# Patient Record
Sex: Female | Born: 2000 | Race: Black or African American | Hispanic: No | Marital: Single | Smoking: Never smoker
Health system: Southern US, Community
[De-identification: ages and names within clinical notes are randomized; demographics above are authoritative.]

---
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
## 2021-11-22 ENCOUNTER — Other Ambulatory Visit: Payer: Self-pay

## 2021-11-22 ENCOUNTER — Inpatient Hospital Stay (HOSPITAL_COMMUNITY)
Admission: AD | Admit: 2021-11-22 | Discharge: 2021-11-22 | Discharge status: Absent without leave | Payer: No Typology Code available for payment source | Attending: Obstetrics and Gynecology | Admitting: Obstetrics and Gynecology

## 2023-04-10 ENCOUNTER — Other Ambulatory Visit: Payer: Self-pay

## 2023-04-10 ENCOUNTER — Encounter (HOSPITAL_BASED_OUTPATIENT_CLINIC_OR_DEPARTMENT_OTHER): Payer: Self-pay

## 2023-04-10 ENCOUNTER — Emergency Department (HOSPITAL_BASED_OUTPATIENT_CLINIC_OR_DEPARTMENT_OTHER)
Admission: EM | Admit: 2023-04-10 | Discharge: 2023-04-10 | Disposition: A | Discharge status: Home / Self Care | Payer: No Typology Code available for payment source | Attending: Emergency Medicine | Admitting: Emergency Medicine

## 2023-04-10 DIAGNOSIS — R110 Nausea: Secondary | ICD-10-CM | POA: Insufficient documentation

## 2023-04-10 DIAGNOSIS — R519 Headache, unspecified: Secondary | ICD-10-CM | POA: Diagnosis present

## 2023-04-10 LAB — PREGNANCY, URINE: Preg Test, Ur: NEGATIVE

## 2023-04-10 LAB — URINALYSIS, MICROSCOPIC (REFLEX)

## 2023-04-10 LAB — URINALYSIS, ROUTINE W REFLEX MICROSCOPIC
Bilirubin Urine: NEGATIVE
Glucose, UA: NEGATIVE mg/dL
Hgb urine dipstick: NEGATIVE
Ketones, ur: 15 mg/dL — AB
Nitrite: NEGATIVE
Protein, ur: 30 mg/dL — AB
Specific Gravity, Urine: 1.015 (ref 1.005–1.030)
pH: 8.5 — ABNORMAL HIGH (ref 5.0–8.0)

## 2023-04-10 MED ORDER — ONDANSETRON 4 MG PO TBDP
4.0000 mg | ORAL_TABLET | Freq: Once | ORAL | Status: AC | PRN
  Administered 2023-04-10: 4 mg via ORAL
  Filled 2023-04-10: qty 1

## 2023-04-10 MED ORDER — METOCLOPRAMIDE HCL 5 MG/ML IJ SOLN
10.0000 mg | Freq: Once | INTRAMUSCULAR | Status: AC
  Administered 2023-04-10: 10 mg via INTRAMUSCULAR
  Filled 2023-04-10: qty 2

## 2023-04-10 MED ORDER — DIPHENHYDRAMINE HCL 25 MG PO CAPS
25.0000 mg | ORAL_CAPSULE | Freq: Once | ORAL | Status: AC
  Administered 2023-04-10: 25 mg via ORAL
  Filled 2023-04-10: qty 1

## 2023-04-10 NOTE — Discharge Instructions (Signed)
Please read and follow all provided instructions.  Your diagnoses today include:  1. Acute nonintractable headache, unspecified headache type     Tests performed today include: Vital signs. See below for your results today.   Medications:  In the Emergency Department you received: Reglan - antinausea/headache medication Benadryl - antihistamine to counteract potential side effects of reglan  Take any prescribed medications only as directed.  Additional information:  Follow any educational materials contained in this packet.  You are having a headache. No specific cause was found today for your headache. It may have been a migraine or other cause of headache. Stress, anxiety, fatigue, and depression are common triggers for headaches.   Your headache today does not appear to be life-threatening or require hospitalization, but often the exact cause of headaches is not determined in the emergency department. Therefore, follow-up with your doctor is very important to find out what may have caused your headache and whether or not you need any further diagnostic testing or treatment.   Sometimes headaches can appear benign (not harmful), but then more serious symptoms can develop which should prompt an immediate re-evaluation by your doctor or the emergency department.  BE VERY CAREFUL not to take multiple medicines containing Tylenol (also called acetaminophen). Doing so can lead to an overdose which can damage your liver and cause liver failure and possibly death.   Follow-up instructions: Please follow-up with your primary care provider in the next 3 days for further evaluation of your symptoms.   Return instructions:  Please return to the Emergency Department if you experience worsening symptoms. Return if the medications do not resolve your headache, if it recurs, or if you have multiple episodes of vomiting or cannot keep down fluids. Return if you have a change from the usual  headache. RETURN IMMEDIATELY IF you: Develop a sudden, severe headache Develop confusion or become poorly responsive or faint Develop a fever above 100.30F or problem breathing Have a change in speech, vision, swallowing, or understanding Develop new weakness, numbness, tingling, incoordination in your arms or legs Have a seizure Please return if you have any other emergent concerns.  Additional Information:  Your vital signs today were: BP (!) 146/91 (BP Location: Left Arm)   Pulse 64   Temp 98.5 F (36.9 C) (Oral)   Resp 18   Ht 5\' 6"  (1.676 m)   Wt 67.1 kg   SpO2 100%   BMI 23.89 kg/m  If your blood pressure (BP) was elevated above 135/85 this visit, please have this repeated by your doctor within one month. --------------

## 2023-04-10 NOTE — ED Provider Notes (Signed)
[place] [facility]ERGENCY DEPARTMENT AT [facility] Provider Note   CSN: [id number] Arrival date & time: 04/10/23  1123     History  Chief Complaint  Patient presents with   Nausea    Mackenzie Ramirez is a 22 y.o. female.  Patient presents the emergency department for evaluation of nausea and headache.  She states that she has had headaches in the past, when she was younger.  She reports a throbbing frontal headache starting yesterday.  She took Tylenol last night with improvement.  When she woke this morning the headache had returned.  She denies photophobia, phonophobia.  No fevers, neck pain, confusion.  No URI symptoms.  No dental pain or sinus pain.  Denies any falls or head injuries.  She has had nausea but no vomiting.       Home Medications Prior to Admission medications   Medication Sig Start Date End Date Taking? Authorizing Provider  naproxen (NAPROSYN) 375 MG tablet Take 1 tablet (375 mg total) by mouth 2 (two) times daily. 11/14/19   Petrucelli, Pleas Koch, PA-C      Allergies    Patient has no known allergies.    Review of Systems   Review of Systems  Physical Exam Updated Vital Signs BP (!) 146/91 (BP Location: Left Arm)   Pulse 64   Temp 98.5 F (36.9 C) (Oral)   Resp 18   Ht 5\' 6"  (1.676 m)   Wt 67.1 kg   SpO2 100%   BMI 23.89 kg/m   Physical Exam Vitals and nursing note reviewed.  Constitutional:      Appearance: She is well-developed.  HENT:     Head: Normocephalic and atraumatic.     Right Ear: Tympanic membrane, ear canal and external ear normal.     Left Ear: Tympanic membrane, ear canal and external ear normal.     Nose: Nose normal.     Mouth/Throat:     Pharynx: Uvula midline.  Eyes:     General: Lids are normal.     Extraocular Movements:     Right eye: No nystagmus.     Left eye: No nystagmus.     Conjunctiva/sclera: Conjunctivae normal.     Pupils: Pupils are equal, round, and reactive to light.  Cardiovascular:      Rate and Rhythm: Normal rate and regular rhythm.  Pulmonary:     Effort: Pulmonary effort is normal.     Breath sounds: Normal breath sounds.  Abdominal:     Palpations: Abdomen is soft.     Tenderness: There is no abdominal tenderness.  Musculoskeletal:     Cervical back: Normal range of motion and neck supple. No tenderness or bony tenderness.  Skin:    General: Skin is warm and dry.  Neurological:     Mental Status: She is alert and oriented to person, place, and time.     GCS: GCS eye subscore is 4. GCS verbal subscore is 5. GCS motor subscore is 6.     Cranial Nerves: No cranial nerve deficit.     Sensory: No sensory deficit.     Motor: No weakness.     Coordination: Coordination normal.     Gait: Gait normal.     Comments: Upper extremity myotomes tested bilaterally:  C5 Shoulder abduction 5/5 C6 Elbow flexion/wrist extension 5/5 C7 Elbow extension 5/5 C8 Finger flexion 5/5 T1 Finger abduction 5/5  Lower extremity myotomes tested bilaterally: L2 Hip flexion 5/5 L3 Knee extension 5/5 L4 Ankle  dorsiflexion 5/5 S1 Ankle plantar flexion 5/5      ED Results / Procedures / Treatments   Labs (all labs ordered are listed, but only abnormal results are displayed) Labs Reviewed  URINALYSIS, ROUTINE W REFLEX MICROSCOPIC - Abnormal; Notable for the following components:      Result Value   pH 8.5 (*)    Ketones, ur 15 (*)    Protein, ur 30 (*)    Leukocytes,Ua SMALL (*)    All other components within normal limits  URINALYSIS, MICROSCOPIC (REFLEX) - Abnormal; Notable for the following components:   Bacteria, UA FEW (*)    All other components within normal limits  PREGNANCY, URINE    EKG None  Radiology No results found.  Procedures Procedures    Medications Ordered in ED Medications  metoCLOPramide (REGLAN) injection 10 mg (has no administration in time range)  diphenhydrAMINE (BENADRYL) capsule 25 mg (has no administration in time range)  ondansetron  (ZOFRAN-ODT) disintegrating tablet 4 mg (4 mg Oral Given 04/10/23 1140)    ED Course/ Medical Decision Making/ A&P    Patient seen and examined. History obtained directly from patient. Work-up including labs, imaging, EKG ordered in triage, if performed, were reviewed.    Labs/EKG: Independently reviewed and interpreted.  This included: Pregnancy negative, UA without compelling signs of infection.  Imaging: None ordered  Medications/Fluids: Ordered: Parent is driving home, IM Reglan, p.o. Benadryl ordered  Most recent vital signs reviewed and are as follows: BP (!) 146/91 (BP Location: Left Arm)   Pulse 64   Temp 98.5 F (36.9 C) (Oral)   Resp 18   Ht 5\' 6"  (1.676 m)   Wt 67.1 kg   SpO2 100%   BMI 23.89 kg/m   Initial impression: Headache, no red flags or reassuring exam.  Will treat symptoms.  1:54 PM Reassessment performed. Patient appears stable.  Family ember now bedside.  Patient states that she is feeling a lot better.  She is comfortable with discharge to home.  Reviewed pertinent lab work and imaging with patient at bedside. Questions answered.   Most current vital signs reviewed and are as follows: BP (!) 146/91 (BP Location: Left Arm)   Pulse 64   Temp 98.5 F (36.9 C) (Oral)   Resp 18   Ht 5\' 6"  (1.676 m)   Wt 67.1 kg   SpO2 100%   BMI 23.89 kg/m   Plan: Discharge to home.   Prescriptions written for: None  Other home care instructions discussed: Rest, hydration, avoidance of triggers  ED return instructions discussed: Worsening severe headache, confusion, vomiting, fever, new symptoms or other concerns  Follow-up instructions discussed: Patient encouraged to follow-up with their PCP as needed for recurrent headache pattern.                                Medical Decision Making Amount and/or Complexity of Data Reviewed Labs: ordered.  Risk Prescription drug management.   In regards to the patient's headache, critical differentials were  considered including subarachnoid hemorrhage, intracerebral hemorrhage, epidural/subdural hematoma, pituitary apoplexy, vertebral/carotid artery dissection, giant cell arteritis, central venous thrombosis, reversible cerebral vasoconstriction, acute angle closure glaucoma, idiopathic intracranial hypertension, bacterial meningitis, viral encephalitis, carbon monoxide poisoning, posterior reversible encephalopathy syndrome, pre-eclampsia.   Reg flag symptoms related to these causes were considered including systemic symptoms (fever, weight loss), neurologic symptoms (confusion, mental status change, vision change, associated seizure), acute or sudden "thunderclap" onset,  patient age 49 or older with new or progressive headache, patient of any age with first headache or change in headache pattern, pregnant or postpartum status, history of HIV or other immunocompromise, history of cancer, headache occurring with exertion, associated neck or shoulder pain, associated traumatic injury, concurrent use of anticoagulation, family history of spontaneous SAH, and concurrent drug use.    Other benign, more common causes of headache were considered including migraine, tension-type headache, cluster headache, referred pain from other cause such as sinus infection, dental pain, trigeminal neuralgia.   On exam, patient has a reassuring neuro exam including baseline mental status, no significant neck pain or meningeal signs, no signs of severe infection or fever.   The patient's vital signs, pertinent lab work and imaging were reviewed and interpreted as discussed in the ED course. Hospitalization was considered for further testing, treatments, or serial exams/observation. However as patient is well-appearing, has a stable exam over the course of their evaluation, and reassuring studies today, I do not feel that they warrant admission at this time. This plan was discussed with the patient who verbalizes agreement and  comfort with this plan and seems reliable and able to return to the Emergency Department with worsening or changing symptoms.          Final Clinical Impression(s) / ED Diagnoses Final diagnoses:  Acute nonintractable headache, unspecified headache type    Rx / DC Orders ED Discharge Orders     None         Renne Crigler, PA-C 04/10/23 1355    Pricilla Loveless, MD 04/10/23 1452

## 2023-04-10 NOTE — ED Triage Notes (Signed)
Pt c/o nausea & headache that started last night.

## 2024-08-05 ENCOUNTER — Encounter: Payer: Self-pay | Admitting: Internal Medicine

## 2024-08-05 ENCOUNTER — Ambulatory Visit (INDEPENDENT_AMBULATORY_CARE_PROVIDER_SITE_OTHER): Admitting: Internal Medicine

## 2024-08-05 ENCOUNTER — Other Ambulatory Visit: Payer: Self-pay

## 2024-08-05 VITALS — BP 112/72 | HR 70 | Temp 98.1°F | Resp 18 | Ht 67.0 in | Wt 173.4 lb

## 2024-08-05 DIAGNOSIS — T783XXD Angioneurotic edema, subsequent encounter: Secondary | ICD-10-CM | POA: Diagnosis not present

## 2024-08-05 DIAGNOSIS — J310 Chronic rhinitis: Secondary | ICD-10-CM

## 2024-08-05 DIAGNOSIS — T783XXA Angioneurotic edema, initial encounter: Secondary | ICD-10-CM

## 2024-08-05 DIAGNOSIS — R21 Rash and other nonspecific skin eruption: Secondary | ICD-10-CM | POA: Diagnosis not present

## 2024-08-05 NOTE — Patient Instructions (Signed)
 Recurrent angioedema of face and tongue Recurrent episodes of angioedema involving the face and tongue since April 2023, with swelling and itching with some hives noted on pictures, but no systemic symptoms.  Episodes lasted for around 1 week.  Suspect histamine mediated.  No history of food or medication allergies, except for a past reaction to kale. Consider contact dermatitis. - Schedule allergy testing for environmental allergens on August 12, 2024, at 9:00 AM. (1-55) - Advise to avoid taking Zyrtec or other antihistamines before the allergy test. - if this occurs again, start Zyrtec 10 mg - 20 mg up to twice daily as needed until symptoms resolve and schedule a follow-up.  Seasonal allergic rhinitis Seasonal allergic rhinitis typically occurs during spring and summer. Symptoms managed with Flonase and occasional use of Zyrtec. - Continue using Flonase 1 to 2 sprays daily as needed. - Use Zyrtec 10 mg daily as needed for symptom relief, but avoid before allergy testing.  Recurrent fungal rash of left breast Recurrent episodes of itching and flaking of the left breast, suggestive of a fungal infection, possibly due to moisture and sweat accumulation. Previously treated with antifungal cream with good response. - Continue using antifungal cream as needed for symptom relief. - Monitor for recurrence and consider patch testing if symptoms do not respond to antifungal treatment.  Follow up : next Friday at 9 AM (1-55) must be off antihistamines for 3 days prior to visit It was a pleasure meeting you in clinic today! Thank you for allowing me to participate in your care.  Rocky Endow, MD [facility]

## 2024-08-05 NOTE — Progress Notes (Signed)
 NEW PATIENT Date of Service/Encounter:   08/05/2024 Referring provider: No ref. provider found Primary care provider: Dwane Alfonso RIGGERS  Subjective:  Mackenzie Ramirez is a 23 y.o. female presenting today for evaluation of history of angioedema with rash and chronic rhinitis. History obtained from: chart review and patient.   Discussed the use of AI scribe software for clinical note transcription with the patient, who gave verbal consent to proceed.  History of Present Illness Mackenzie Ramirez is a 23 year old female who presents with recurrent episodes of facial and tongue swelling and itching.  Angioedema and pruritus - Recurrent episodes of facial and tongue swelling with associated pruritus since April 2023 - Initial episode lasted approximately 1.5 weeks, with significant swelling and itching around the eyes and tongue - Symptoms were pruritic but without significant rash - on review of pictures, angioedema noted on eyelids with hives under lids and partial tongue angioedema - No identifiable trigger; attended a party the night before initial episode - Hydrocortisoneprovided symptomatic relief for itching during initial episode - did not trial antihistamines for symptoms  Localized rash and pruritus - In August 2024, developed pruritus and a rash with peeling and flaking skin on the left breast - Rash and itching resolved with use of prescribed antifungal cream - Rash recurred after visiting boyfriend in [place], again resolved with antifungal cream, currently not present  Allergic and atopic history - History of seasonal allergies, particularly during spring and summer - Occasional use of Zyrtec for allergy symptoms - uses flonase regularly, especially during warmer months. - No recent allergy testing; last testing performed in elementary school - No history of acid reflux, asthma, or eczema - No known food or medication allergies except for a past reaction to kale,  which caused hand swelling; avoids kale as a precaution - Tolerates spinach without issues   Past Medical History: No past medical history on file. Medication List:  Current Outpatient Medications  Medication Sig Dispense Refill   fluticasone (FLONASE) 50 MCG/ACT nasal spray Place 1 spray into the nose.     naproxen  (NAPROSYN ) 375 MG tablet Take 1 tablet (375 mg total) by mouth 2 (two) times daily. 10 tablet 0   terconazole (TERAZOL 7) 0.4 % vaginal cream Place 1 applicator vaginally at bedtime.     No current facility-administered medications for this visit.   Known Allergies:  No Known Allergies Past Surgical History: No past surgical history on file. Family History: No family history on file. Social History: Mackenzie Ramirez lives in a house built 6 years ago, no water damage, carpet in the bedroom, gas and electric heating, central AC, no pets, no roaches, not using dust mite covers on the bed of the pillows, no smoke exposure.  Works as a Scientist, physiological.  [place]   ROS:  All other systems negative except as noted per HPI.  Objective:  Blood pressure 112/72, pulse 70, temperature 98.1 F (36.7 C), temperature source Temporal, resp. rate 18, height 5' 7 (1.702 m), weight 173 lb 6.4 oz (78.7 kg), SpO2 98%. Body mass index is 27.16 kg/m. Physical Exam:  General Appearance:  Alert, cooperative, no distress, appears stated age  Head:  Normocephalic, without obvious abnormality, atraumatic  Eyes:  Conjunctiva clear, EOM's intact  Ears EACs normal bilaterally and normal TMs bilaterally  Nose: Nares normal, hypertrophic turbinates, normal mucosa, and no visible anterior polyps  Throat: Lips, tongue normal; teeth and gums normal, normal posterior oropharynx  Neck: Supple, symmetrical  Lungs:  clear to auscultation bilaterally, Respirations unlabored, no coughing  Heart:  regular rate and rhythm and no murmur, Appears well perfused  Extremities: No edema   Skin: Skin color, texture, turgor normal and no rashes or lesions on visualized portions of skin  Neurologic: No gross deficits   Diagnostics:  Labs:  Lab Orders  No laboratory test(s) ordered today   Assessment and Plan  Assessment and Plan Assessment & Plan Recurrent angioedema of face and tongue Recurrent episodes of angioedema involving the face and tongue since April 2023, with swelling and itching with some hives noted on pictures, but no systemic symptoms.  Episodes lasted for around 1 week.  Suspect histamine mediated.  No history of food or medication allergies, except for a past reaction to kale. Consider contact dermatitis. - Schedule allergy testing for environmental allergens on August 12, 2024, at 9:00 AM. (1-55) - Advise to avoid taking Zyrtec or other antihistamines before the allergy test. - if this occurs again, start Zyrtec 10 mg - 20 mg up to twice daily as needed until symptoms resolve and schedule a follow-up.  Seasonal allergic rhinitis Seasonal allergic rhinitis typically occurs during spring and summer. Symptoms managed with Flonase and occasional use of Zyrtec. - Continue using Flonase 1 to 2 sprays daily as needed. - Use Zyrtec 10 mg daily as needed for symptom relief, but avoid before allergy testing.  Recurrent fungal rash of left breast Recurrent episodes of itching and flaking of the left breast, suggestive of a fungal infection, possibly due to moisture and sweat accumulation. Previously treated with antifungal cream with good response. - Continue using antifungal cream as needed for symptom relief. - Monitor for recurrence and consider patch testing if symptoms do not respond to antifungal treatment.  Follow up : next Friday at 9 AM (1-55) must be off antihistamines for 3 days prior to visit It was a pleasure meeting you in clinic today! Thank you for allowing me to participate in your care.  Rocky Endow, MD [facility]     This note in its entirety was forwarded to the Provider who requested this consultation.  Other: none  Thank you for your kind referral. I appreciate the opportunity to take part in Mackenzie Ramirez care. Please do not hesitate to contact me with questions.  Sincerely,  Rocky Endow, MD [facility] 

## 2024-08-12 ENCOUNTER — Ambulatory Visit: Admitting: Internal Medicine

## 2024-08-12 ENCOUNTER — Encounter: Payer: Self-pay | Admitting: Internal Medicine

## 2024-08-12 DIAGNOSIS — J310 Chronic rhinitis: Secondary | ICD-10-CM | POA: Diagnosis not present

## 2024-08-12 NOTE — Patient Instructions (Addendum)
 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  Episodes lasted for around 1 week.  Suspect histamine mediated.  No history of food or medication allergies, except for a past reaction to kale. Consider contact dermatitis. - if this occurs again, start Zyrtec 10 mg - 20 mg up to twice daily as needed until symptoms resolve and schedule a follow-up. - write down anything you did or ate the 4 hours prior to onset of symptoms  Chronic rhinitis-Nonallergic Chronic rhinitis typically occurs during spring and summer. Symptoms managed with Flonase and occasional use of Zyrtec. - Continue using Flonase 1 to 2 sprays daily as needed. - Use Zyrtec 10 mg daily as needed for symptom relief if helpful - Skin testing environmental 08/12/24: negative on skin and intradermal testing  Recurrent fungal rash of left breast Recurrent episodes of itching and flaking of the left breast, suggestive of a fungal infection, possibly due to moisture and sweat accumulation. Previously treated with antifungal cream with good response. - Continue using antifungal cream as needed for symptom relief. - Monitor for recurrence and consider patch testing if symptoms do not respond to antifungal treatment.  Follow up : as needed It was a pleasure seeing you again in clinic today! Thank you for allowing me to participate in your care.  Rocky Endow, MD [facility]

## 2024-08-12 NOTE — Progress Notes (Signed)
 Date of Service/Encounter:  08/12/24  Allergy testing appointment   Initial visit on 08/05/24, seen for recurrent angioedema, seasonal allergic rhinitis.  Please see that note for additional details.  Today reports for allergy diagnostic testing:    DIAGNOSTICS:  Skin Testing: Environmental allergy panel. Adequate positive and negative controls. Results discussed with patient/family.  Airborne Adult Perc - 08/12/24 0903     Time Antigen Placed 0900    Allergen Manufacturer Jestine    Location Back    Number of Test 55    1. Control-Buffer 50% Glycerol Negative    2. Control-Histamine 3+    3. Bahia Negative    4. French Southern Territories Negative    5. Johnson Negative    6. [place]  Blue Negative    7. Meadow Fescue Negative    8. Perennial Rye Negative    9. Timothy Negative    10. Ragweed Mix Negative    11. Cocklebur Negative    12. Plantain,  English Negative    13. Baccharis Negative    14. Dog Fennel Negative    15. Russian Thistle Negative    16. Lamb's Quarters Negative    17. Sheep Sorrell Negative    18. Rough Pigweed Negative    19. Marsh Elder, Rough Negative    20. Mugwort, Common Negative    21. Box, Elder Negative    22. Cedar, red Negative    23. Sweet Gum Negative    24. Pecan Pollen Negative    25. Pine Mix Negative    26. Walnut, Black Pollen Negative    27. Red Mulberry Negative    28. Ash Mix Negative    29. Birch Mix Negative    30. Beech American Negative    31. Cottonwood, [place] Negative    32. Hickory, White Negative    33. Maple Mix Negative    34. Oak, [place] Mix Negative    35. Sycamore Eastern Negative    36. Alternaria Alternata Negative    37. Cladosporium Herbarum Negative    38. Aspergillus Mix Negative    39. Penicillium Mix Negative    40. Bipolaris Sorokiniana (Helminthosporium) Negative    41. Drechslera Spicifera (Curvularia) Negative    42. Mucor Plumbeus Negative    43. Fusarium Moniliforme Negative    44. Aureobasidium  Pullulans (pullulara) Negative    45. Rhizopus Oryzae Negative    46. Botrytis Cinera Negative    47. Epicoccum Nigrum Negative    48. Phoma Betae Negative    49. Dust Mite Mix Negative    50. Cat Hair 10,000 BAU/ml Negative    51.  Dog Epithelia Negative    52. Mixed Feathers Negative    53. Horse Epithelia Negative    54. Cockroach, German Negative    55. Tobacco Leaf Negative          Intradermal - 08/12/24 1002     Time Antigen Placed 0945    Allergen Manufacturer Jestine    Location Arm    Number of Test 16    Control 3+    Bahia Negative    French Southern Territories Negative    Johnson Negative    7 Grass Negative    Ragweed Mix Negative    Weed Mix Negative    Tree Mix Negative    Mold 1 Negative    Mold 2 Negative    Mold 3 Negative    Mold 4 Negative    Mite Mix Negative    Cat Negative  Dog Negative    Cockroach Negative    Other Negative          Allergy testing results were read and interpreted by myself, documented by clinical staff.  Patient provided with copy of allergy testing along with avoidance measures when indicated.   Rocky Endow, MD  [facility]    -------------------------------------- Recurrent angioedema of face and tongue Recurrent episodes of angioedema involving the face and tongue since April 2023, with swelling and itching with some hives noted on pictures, but no systemic symptoms.  Episodes lasted for around 1 week.  Suspect histamine mediated.  No history of food or medication allergies, except for a past reaction to kale. Consider contact dermatitis. - if this occurs again, start Zyrtec 10 mg - 20 mg up to twice daily as needed until symptoms resolve and schedule a follow-up. - write down anything you did or ate the 4 hours prior to onset of symptoms  Chronic rhinitis-Nonallergic Chronic rhinitis typically occurs during spring and summer. Symptoms managed with Flonase and occasional use of Zyrtec. - Continue  using Flonase 1 to 2 sprays daily as needed. - Use Zyrtec 10 mg daily as needed for symptom relief if helpful - Skin testing environmental 08/12/24: negative on skin and intradermal testing
